# Patient Record
Sex: Female | Born: 1985 | Race: White | Hispanic: No | Marital: Married | Smoking: Never smoker
Health system: Southern US, Community
[De-identification: ages and names within clinical notes are randomized; demographics above are authoritative.]

## PROBLEM LIST (undated history)

## (undated) DIAGNOSIS — N200 Calculus of kidney: Secondary | ICD-10-CM

## (undated) DIAGNOSIS — N809 Endometriosis, unspecified: Secondary | ICD-10-CM

## (undated) HISTORY — PX: TYMPANOSTOMY TUBE PLACEMENT: SHX32

## (undated) HISTORY — PX: TONSILLECTOMY: SUR1361

---

## 2013-07-19 ENCOUNTER — Emergency Department: Payer: Self-pay

## 2013-07-19 LAB — URINALYSIS, COMPLETE
Bacteria: NONE SEEN
Bilirubin,UR: NEGATIVE
Blood: NEGATIVE
Glucose,UR: NEGATIVE mg/dL (ref 0–75)
Ketone: NEGATIVE
Leukocyte Esterase: NEGATIVE
Nitrite: NEGATIVE
Ph: 6 (ref 4.5–8.0)
Protein: NEGATIVE
RBC, UR: NONE SEEN /HPF (ref 0–5)
Specific Gravity: 1.01 (ref 1.003–1.030)
Squamous Epithelial: 1
WBC UR: NONE SEEN /HPF (ref 0–5)

## 2013-07-19 LAB — CBC
HCT: 36.7 % (ref 35.0–47.0)
HGB: 12.6 g/dL (ref 12.0–16.0)
MCH: 29.9 pg (ref 26.0–34.0)
MCHC: 34.2 g/dL (ref 32.0–36.0)
MCV: 87 fL (ref 80–100)
PLATELETS: 223 10*3/uL (ref 150–440)
RBC: 4.2 10*6/uL (ref 3.80–5.20)
RDW: 12 % (ref 11.5–14.5)
WBC: 8.2 10*3/uL (ref 3.6–11.0)

## 2013-07-19 LAB — HCG, QUANTITATIVE, PREGNANCY: BETA HCG, QUANT.: 34965 m[IU]/mL — AB

## 2013-08-16 ENCOUNTER — Emergency Department: Payer: Self-pay | Admitting: Emergency Medicine

## 2014-01-02 ENCOUNTER — Observation Stay: Payer: Self-pay | Admitting: Obstetrics and Gynecology

## 2014-01-02 LAB — URINALYSIS, COMPLETE
Bacteria: NONE SEEN
Bilirubin,UR: NEGATIVE
Blood: NEGATIVE
GLUCOSE, UR: NEGATIVE mg/dL (ref 0–75)
LEUKOCYTE ESTERASE: NEGATIVE
Nitrite: NEGATIVE
Ph: 6 (ref 4.5–8.0)
Protein: NEGATIVE
RBC,UR: <1 /HPF (ref 0–5)
SPECIFIC GRAVITY: 1.009 (ref 1.003–1.030)
WBC UR: <1 /HPF (ref 0–5)

## 2014-01-02 LAB — CBC WITH DIFFERENTIAL/PLATELET
Basophil #: 0 10*3/uL (ref 0.0–0.1)
Basophil %: 0.4 %
Eosinophil #: 0 10*3/uL (ref 0.0–0.7)
Eosinophil %: 0.1 %
HCT: 35.9 % (ref 35.0–47.0)
HGB: 12 g/dL (ref 12.0–16.0)
Lymphocyte #: 1.6 10*3/uL (ref 1.0–3.6)
Lymphocyte %: 18.1 %
MCH: 29.6 pg (ref 26.0–34.0)
MCHC: 33.5 g/dL (ref 32.0–36.0)
MCV: 88 fL (ref 80–100)
MONOS PCT: 3 %
Monocyte #: 0.3 x10 3/mm (ref 0.2–0.9)
Neutrophil #: 7.1 10*3/uL — ABNORMAL HIGH (ref 1.4–6.5)
Neutrophil %: 78.4 %
Platelet: 158 10*3/uL (ref 150–440)
RBC: 4.07 10*6/uL (ref 3.80–5.20)
RDW: 13.3 % (ref 11.5–14.5)
WBC: 9 10*3/uL (ref 3.6–11.0)

## 2014-01-02 LAB — BASIC METABOLIC PANEL
ANION GAP: 10 (ref 7–16)
BUN: 4 mg/dL — ABNORMAL LOW (ref 7–18)
CALCIUM: 8.6 mg/dL (ref 8.5–10.1)
CHLORIDE: 113 mmol/L — AB (ref 98–107)
CREATININE: 0.45 mg/dL — AB (ref 0.60–1.30)
Co2: 20 mmol/L — ABNORMAL LOW (ref 21–32)
EGFR (African American): >60
EGFR (Non-African Amer.): >60
Glucose: 71 mg/dL (ref 65–99)
OSMOLALITY: 280 (ref 275–301)
POTASSIUM: 3.5 mmol/L (ref 3.5–5.1)
Sodium: 143 mmol/L (ref 136–145)

## 2014-01-02 IMAGING — US US RENAL KIDNEY
1 series · 14 of 25 positions shown · non-contrast
Comparison: None.

CLINICAL DATA: Left flank pain.

EXAM:
RENAL/URINARY TRACT ULTRASOUND COMPLETE

[Series 1: us renal kidney · 0.28mm/px · 14 of 41 slices shown]
[im 1/41]
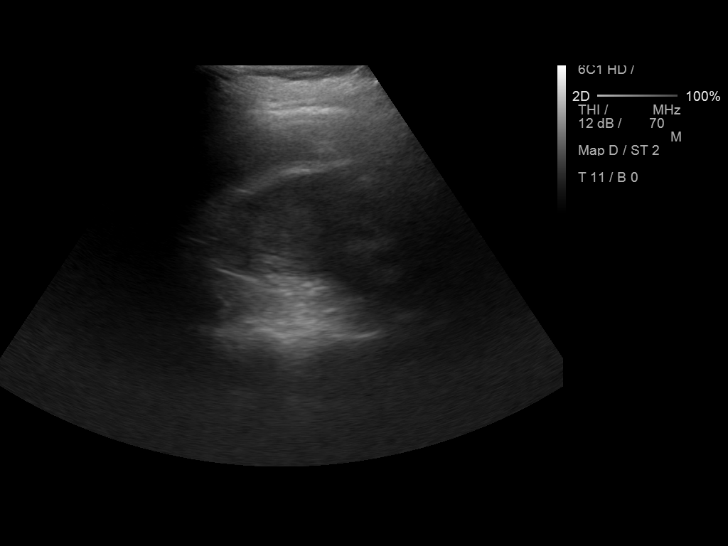
[im 4/41]
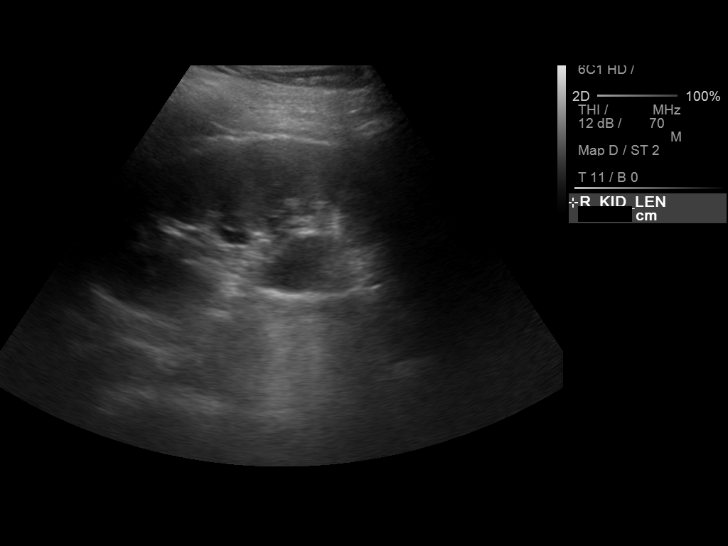
[im 7/41]
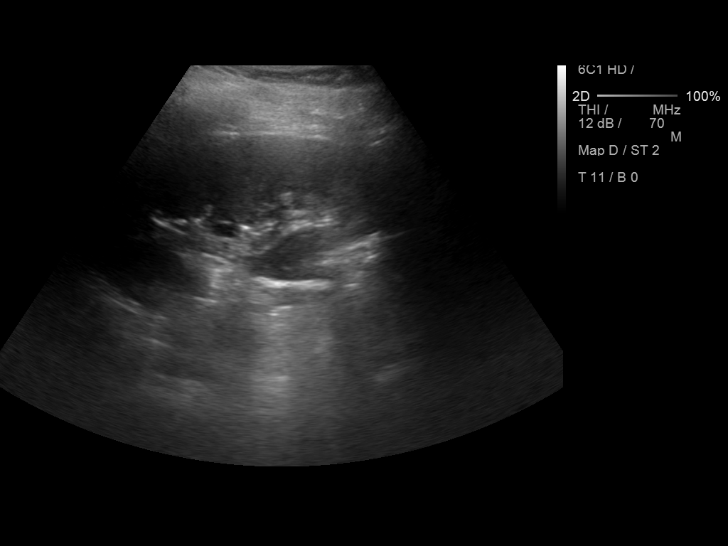
[im 11/41]
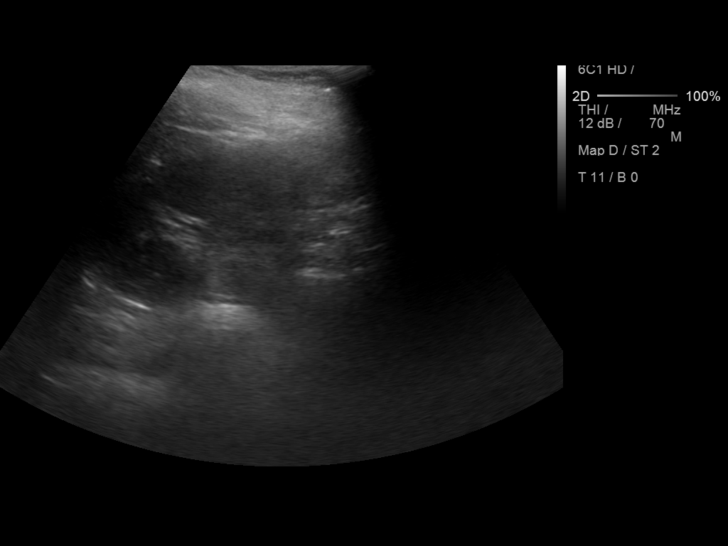
[im 14/41]
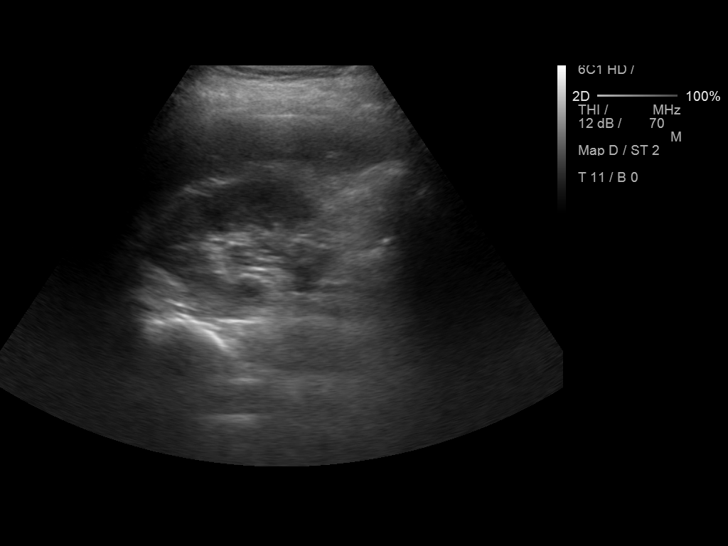
[im 16/41]
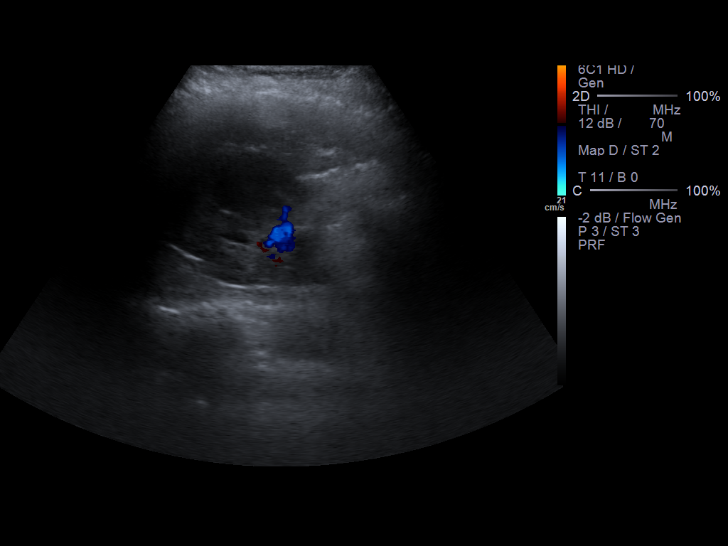
[im 19/41]
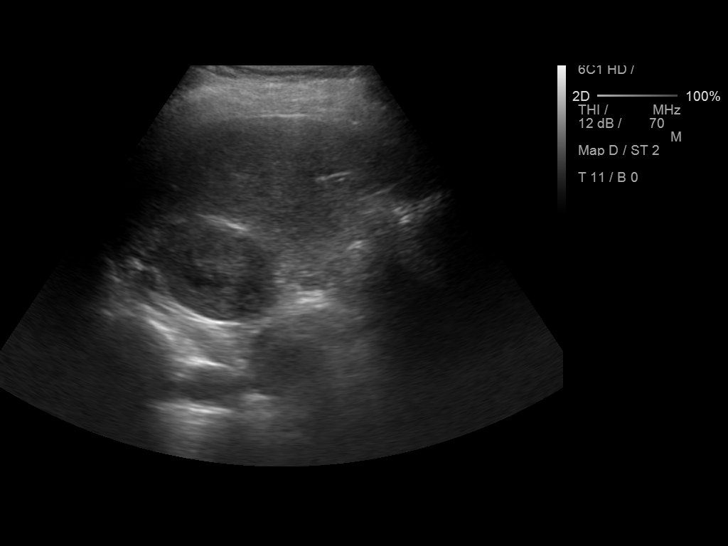
[im 22/41]
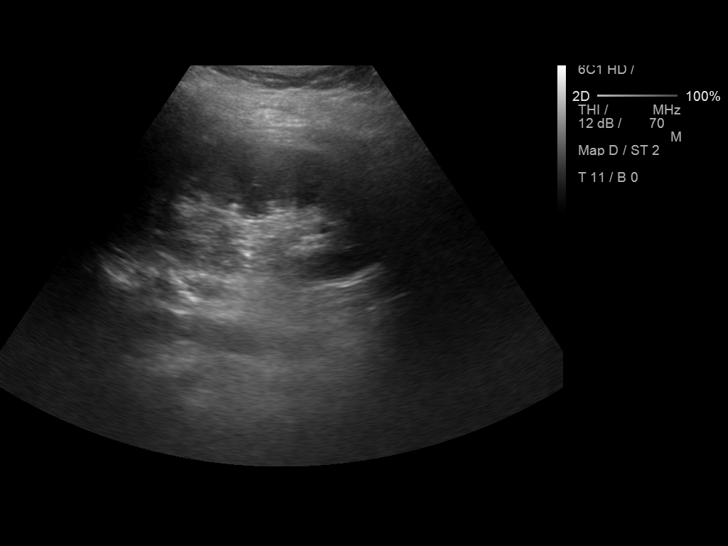
[im 26/41]
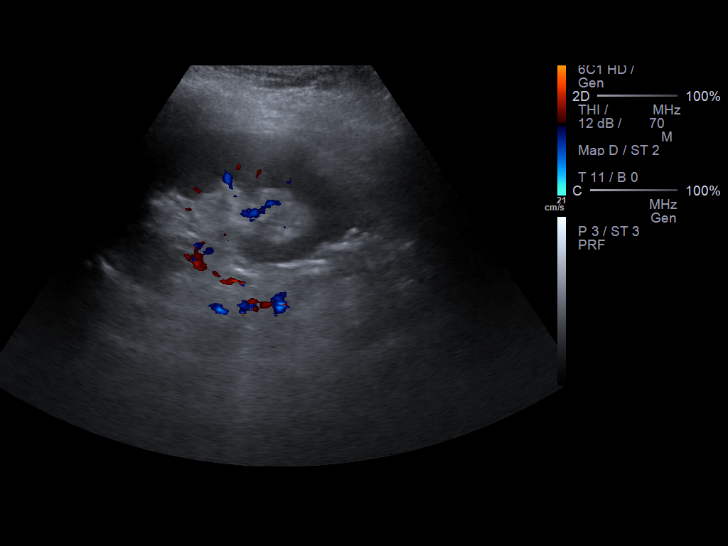
[im 27/41]
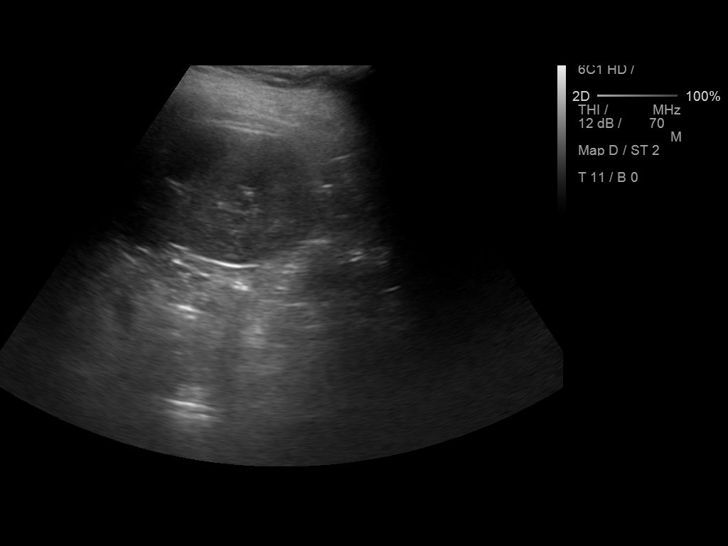
[im 31/41]
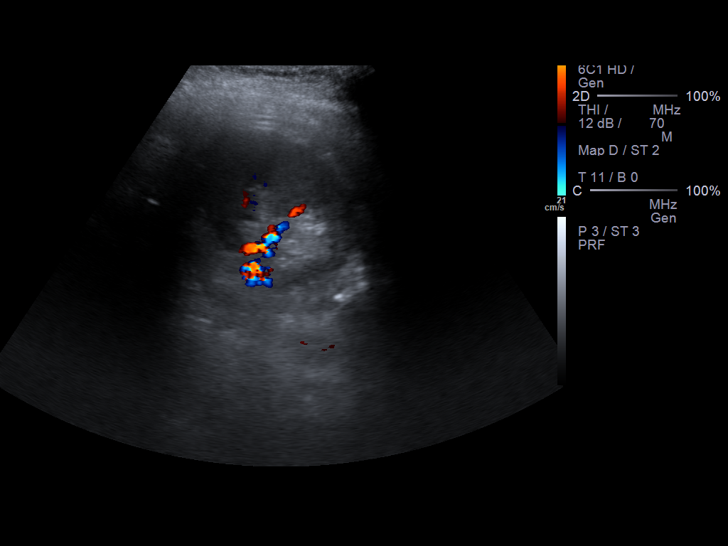
[im 34/41]
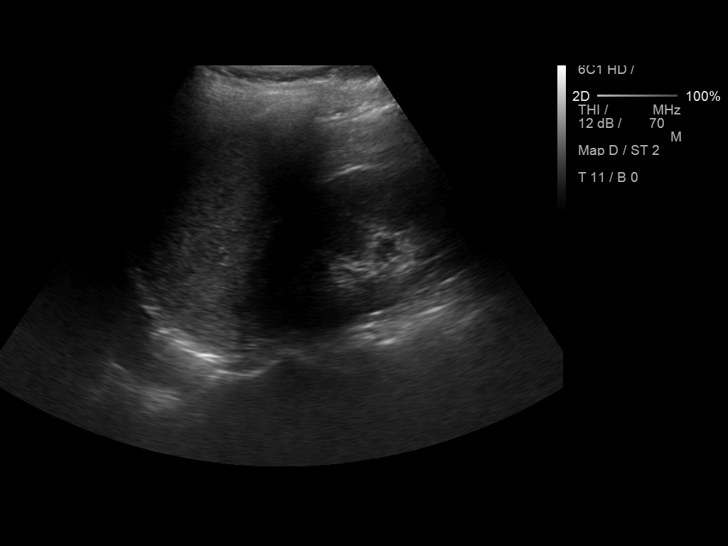
[im 37/41]
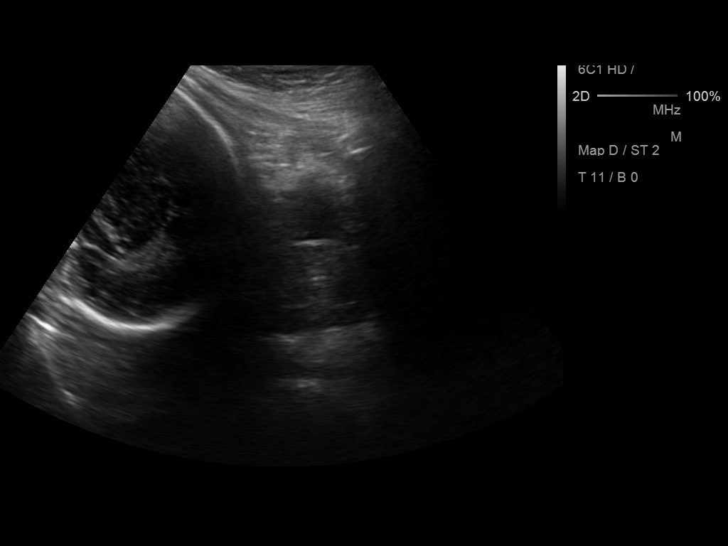
[im 41/41]
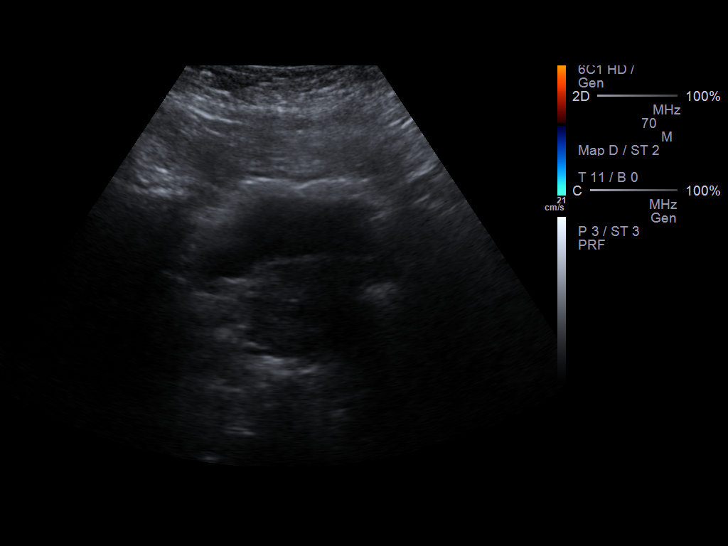

[14 of 25 positions shown; findings below may reference images not displayed]

FINDINGS: Right Kidney:

Length: 12.5 cm. Echogenicity within normal limits. Prominent
extrarenal pelvis is noted without definite hydronephrosis.

Left Kidney:

Length: 10.9 cm. Echogenicity within normal limits. No mass or
hydronephrosis visualized.

Bladder:

Appears normal for degree of bladder distention.
IMPRESSION: Prominent extrarenal pelvis is noted on the right without definite
hydronephrosis. No other renal abnormality seen.

## 2014-01-04 ENCOUNTER — Observation Stay: Payer: Self-pay

## 2014-01-04 LAB — DRUG SCREEN, URINE

## 2014-01-04 LAB — URINALYSIS, COMPLETE
Bacteria: NONE SEEN
Bilirubin,UR: NEGATIVE
Glucose,UR: NEGATIVE mg/dL (ref 0–75)
Leukocyte Esterase: NEGATIVE
Nitrite: NEGATIVE
PH: 6 (ref 4.5–8.0)
PROTEIN: NEGATIVE
Specific Gravity: 1.004 (ref 1.003–1.030)
Squamous Epithelial: 1
WBC UR: 1 /HPF (ref 0–5)

## 2014-01-04 LAB — URINE CULTURE

## 2014-01-04 IMAGING — US US RENAL KIDNEY
1 series · 14 of 25 positions shown · non-contrast
Comparison: Renal ultrasound January 02, 2014.

CLINICAL DATA: Suspect kidney stones and hydronephrosis ; the
patient is 35 weeks pregnant and complaining of low back pain.

EXAM:
RENAL/URINARY TRACT ULTRASOUND COMPLETE

[Series 1: us renal kidney · 0.28mm/px · 14 of 36 slices shown]
[im 1/36]
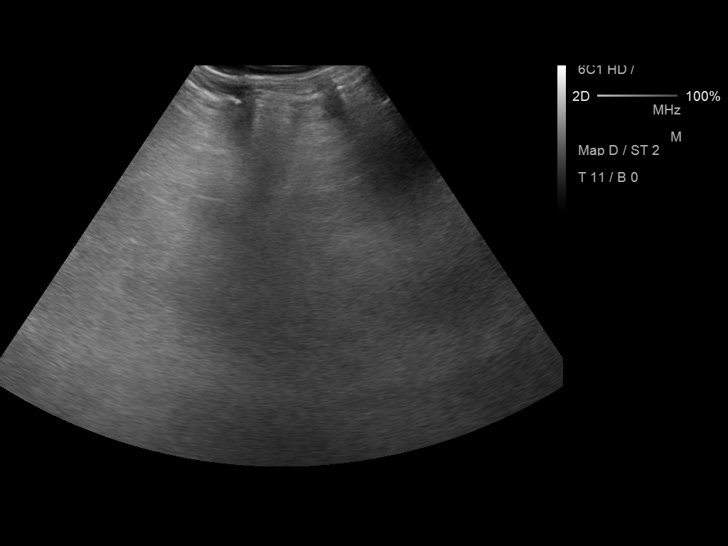
[im 3/36]
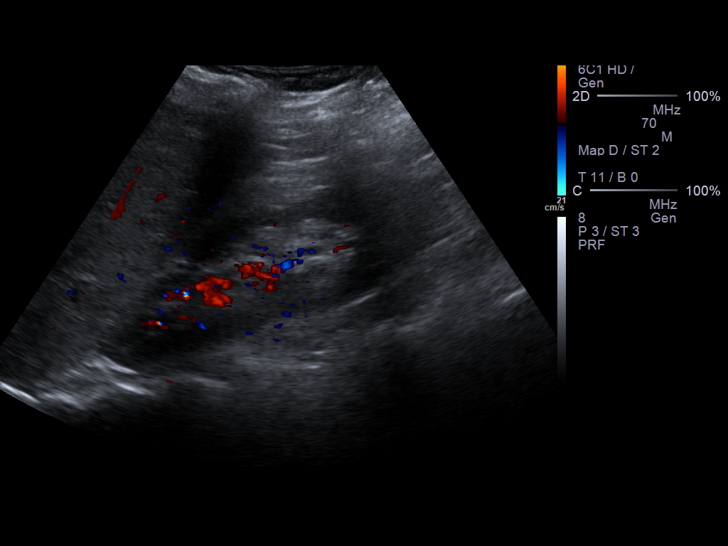
[im 6/36]
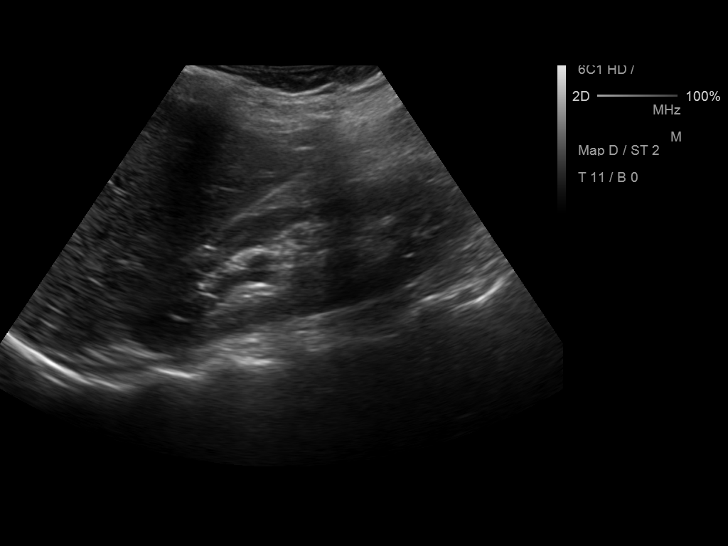
[im 9/36]
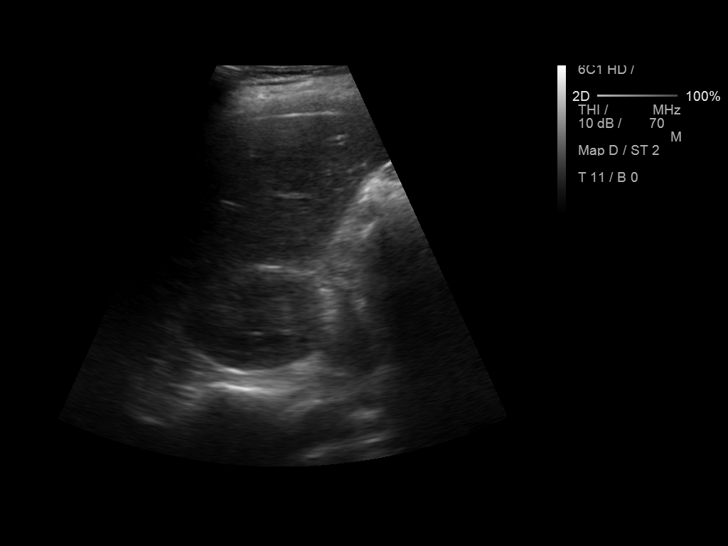
[im 12/36]
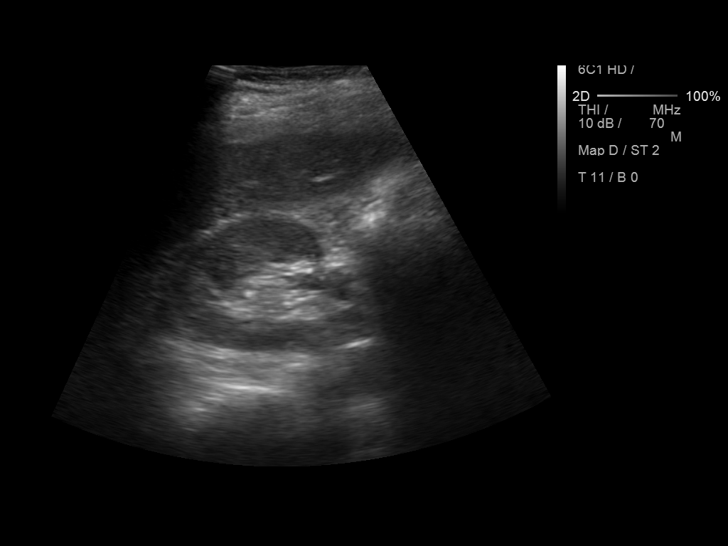
[im 14/36]
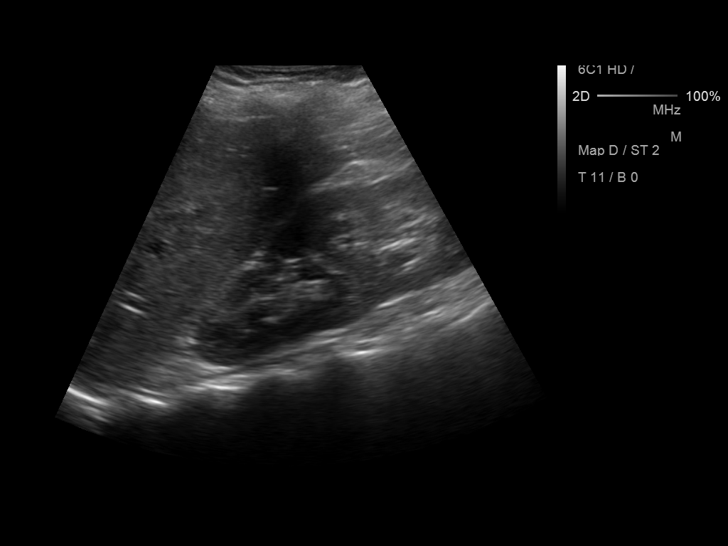
[im 17/36]
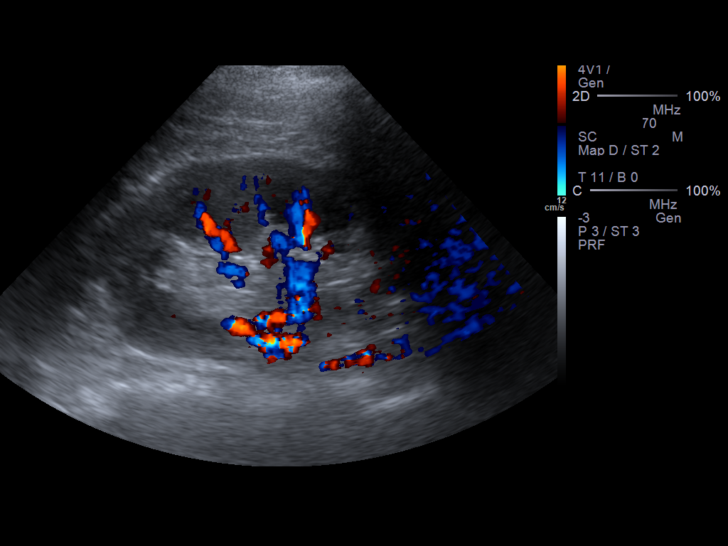
[im 19/36]
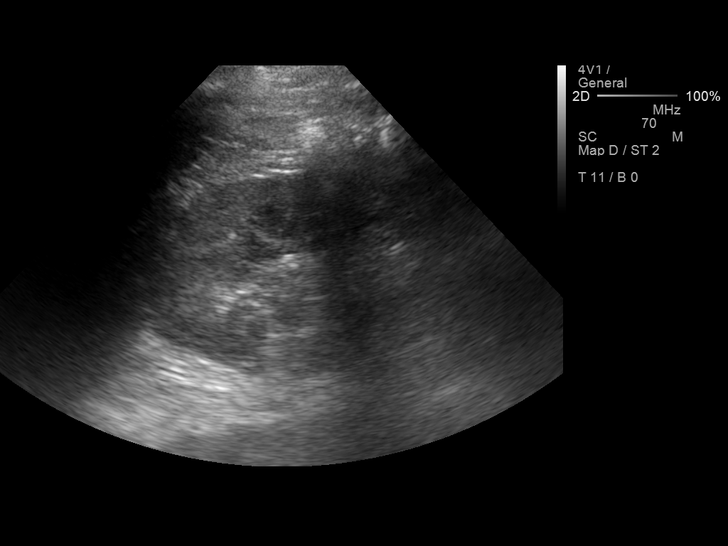
[im 22/36]
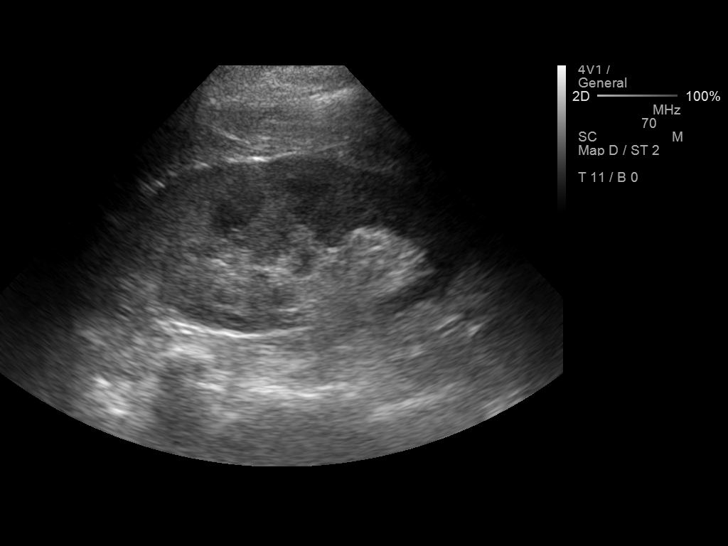
[im 24/36]
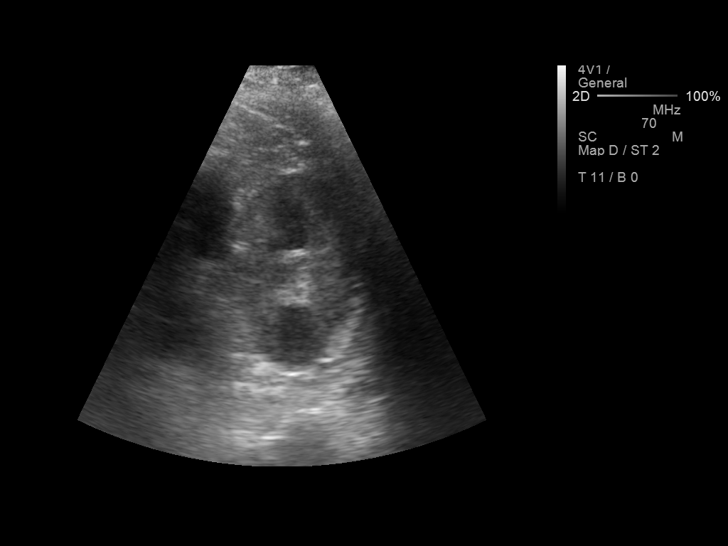
[im 27/36]
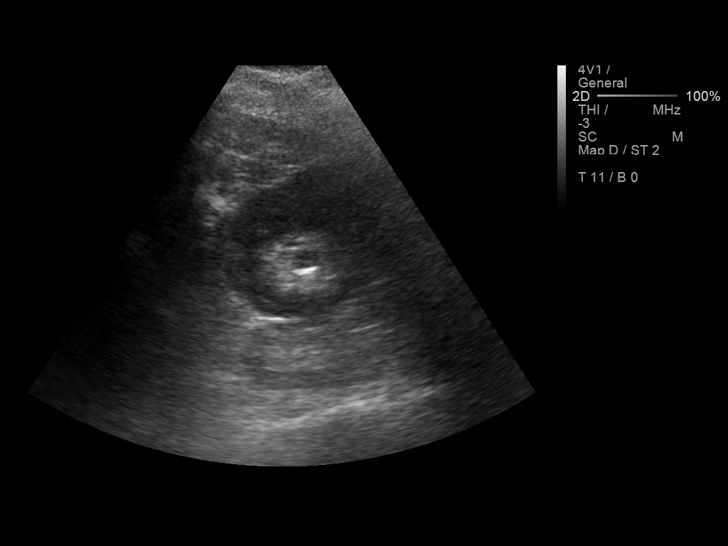
[im 30/36]
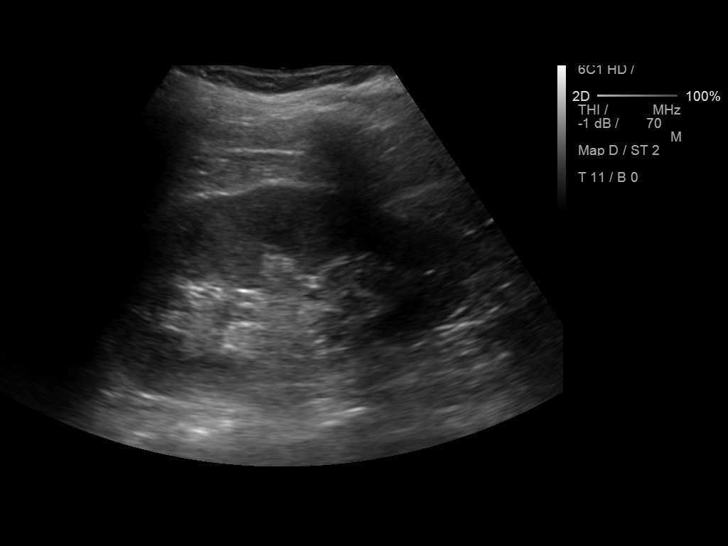
[im 33/36]
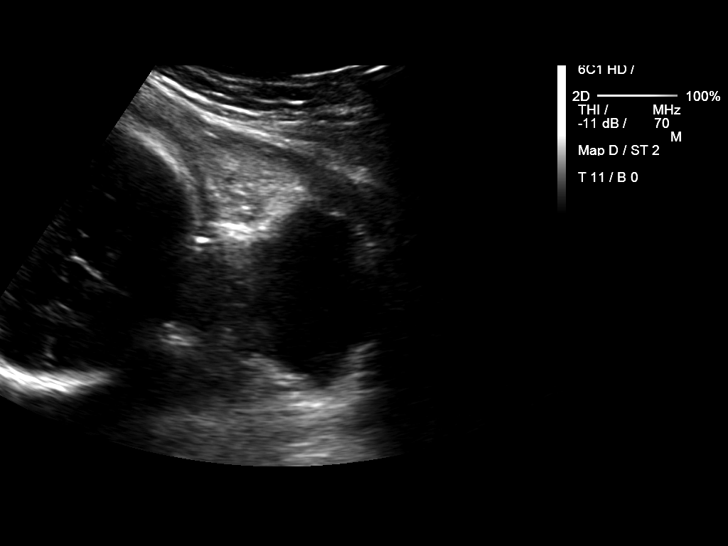
[im 36/36]
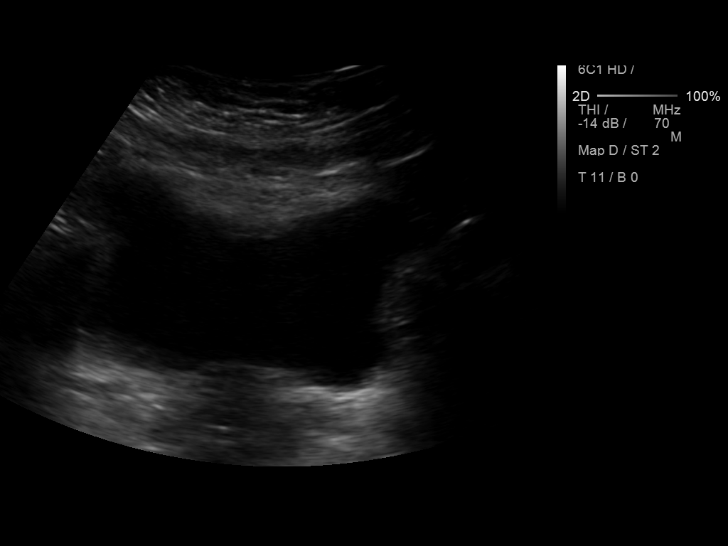

[14 of 25 positions shown; findings below may reference images not displayed]

FINDINGS: Right Kidney:

Length: 13 cm. Echogenicity within normal limits. No mass or
hydronephrosis visualized.

Left Kidney:

Length: 11.5 cm. Echogenicity within normal limits. No mass or
hydronephrosis visualized.

Bladder:

Appears normal for degree of bladder distention.
IMPRESSION: There is no evidence of significant hydronephrosis. No calcified
shadowing stones are demonstrated.

## 2014-08-28 NOTE — H&P (Signed)
L&D Evaluation:  History Expanded:  HPI 29 y/o G1 @ 35 wks (EDC 10/22 based on 7wk u/s), with c/o back and flank pain. Pt initially evaluated for same on 9/15. Work-up essentially normal, Renal US showing mild dilated pelvises. Pt was given Rx for #15 Percocet which she states she took around the clock since leaving. Her last dose was around midnight and pt now reports intense pain has returned. Reports pain is similar, but much more intense that prior kidney stones she has had.  Preg c/b IUI pregnancy, h/o nephrolithiasis and endometriosis.  She denies any current issues with this pregnancy. [place]. She denies any aggravating or precipitating event and denies any OB s/s such as VB, LOF or decreased FM, fevers, chills. Reporting nausea and dry heaving with pain.   Patient's Medical History as per hpi. endometriosis   Patient's Surgical History diagnostic laparoscopy x 3 for endometriosis   Medications Pre Natal Vitamins  Percocet #15 since 9/15   Allergies vancomycin, lyrica, flagyl   Social History none   Exam:  Vital Signs stable   General appears in pain and uncomfortable   Mental Status clear   Chest ctab   Heart rrr no mrgs   Abdomen gravid, non-tender   Back + CVAT bilaterally   Edema no edema   Pelvic 1/50/-3 per RN   Mebranes Intact   FHT baseline 140, moderate variability, + accels   Ucx absent   Impression:  Impression IUP at 35 weeks with back pain, ?nephrolithiasis   Plan:  Plan UA   Comments UDS to ensure + oxycodone IV fluids, Morphine, phenergan   Electronic Signatures: Jazzmyn Filion, Marta Lamasamara K (CNM)  (Signed 17-Sep-15 07:04)  Authored: L&D Evaluation   Last Updated: 17-Sep-15 07:04 by Vella KohlerBrothers, Kiaan Overholser K (CNM)

## 2014-08-28 NOTE — H&P (Signed)
L&D Evaluation:  History:  HPI -CC: mid low back pain x 1 day  -HPI: 29 y/o G1 @ 34/5 (EDC 10/22 based on 7wk u/s), with above CC. Preg c/b IUI pregnancy, h/o nephrolithiasis and endometriosis.  She denies any current issues with this pregnancy. [place] and started having the low back pain while riding on the airplane last night.  She denies any aggravating or precipitating event and denies any OB s/s such as VB, LOF or decreased FM, fevers, chills. +rare abdominal cramping  Patient states that she's had some increased urinary frequency with low amounts of voiding but denies any dysuria, hematuria, malodorous urine. She states that the pain radiates somewhat up the left back and is starting to feel similar to prior stone episodes, with her last one being last year and passed spontaneously.  She did have small amount of n/v of clear fluid with pain exacerbation but no other GI s/s.  She took some flexeril, which she got early in pregnancy and some apap earlier this afternoon.   Patient's Medical History as per hpi. endometriosis   Patient's Surgical History diagnostic laparoscopy x 3 for endometriosis   Medications as per hpi   Allergies vancomycin, lyrica, flagyl   Social History none   Exam:  Vital Signs AF VS normal and stable   General appears in pain and uncomfortable   Mental Status clear   Chest ctab   Heart rrr no mrgs   Abdomen gravid, non-tender   Back ? CVAT bilaterally. back is just mildly diffusely TTP, especially in the low back area   Edema no edema   Pelvic cl/50/-3/intermediate/mid position   Mebranes Intact   FHT 160 baseline, +accels, no decels (some maternal artifact), mod var   Ucx absent   Impression:  Impression ?stone, low back pain; patient currently stable   Plan:  Comments *IUP: pulse ox placed but patient moving around a lot, due to the discomfort, and with great FM causing some difficulty  with tracinig. will try and obtain records *EOL: recheck cx in 4590mins/when labs are back *Renal: U/A, UCx, BMP, CBC and renal u/s ordered. Depending on lab results, may admit for presumptive pyelo *FEN/GI: regular diet, IVF bolus 1L and MIVF at 150mL.  PRN IV zofran. low suspicion for GI process, such as appy, given her s/s and lack of GI s/s outside of with pain association *Analgesia: PO PRNs ordered   Electronic Signatures: [place] BingPickens, Zvi Duplantis (MD)  (Signed 15-Sep-15 19:47)  Authored: L&D Evaluation   Last Updated: 15-Sep-15 19:47 by [place] BingPickens, Amarilis Belflower (MD)

## 2016-05-02 ENCOUNTER — Emergency Department
Admission: EM | Admit: 2016-05-02 | Discharge: 2016-05-02 | Disposition: A | Discharge status: Home / Self Care | Attending: Emergency Medicine | Admitting: Emergency Medicine

## 2016-05-02 ENCOUNTER — Encounter: Payer: Self-pay | Admitting: Emergency Medicine

## 2016-05-02 DIAGNOSIS — A4902 Methicillin resistant Staphylococcus aureus infection, unspecified site: Secondary | ICD-10-CM | POA: Insufficient documentation

## 2016-05-02 DIAGNOSIS — R21 Rash and other nonspecific skin eruption: Secondary | ICD-10-CM | POA: Diagnosis present

## 2016-05-02 DIAGNOSIS — B958 Unspecified staphylococcus as the cause of diseases classified elsewhere: Secondary | ICD-10-CM

## 2016-05-02 DIAGNOSIS — L089 Local infection of the skin and subcutaneous tissue, unspecified: Secondary | ICD-10-CM

## 2016-05-02 HISTORY — DX: Endometriosis, unspecified: N80.9

## 2016-05-02 HISTORY — DX: Calculus of kidney: N20.0

## 2016-05-02 MED ORDER — EUCERIN EX CREA: TOPICAL_CREAM | CUTANEOUS | 0 refills | Status: AC | PRN

## 2016-05-02 MED ORDER — CLINDAMYCIN HCL 300 MG PO CAPS: 300.0000 mg | ORAL_CAPSULE | Freq: Four times a day (QID) | ORAL | 0 refills | Status: AC

## 2016-05-02 NOTE — ED Triage Notes (Signed)
Patient states that she was diagnosed with staph infection under bilateral arms. Patient states that she started antibiotics about 2 weeks ago but that the pain has become worse tonight. Patient tearful at stat desk.

## 2016-05-02 NOTE — ED Notes (Addendum)
Pt reports staph infection under both arms, was on antibiotics, sulfameth, completed course few days ago.

## 2016-05-02 NOTE — ED Provider Notes (Signed)
[facility] Emergency Department Provider Note  ____________________________________________  Time seen: Approximately 11:47 PM  I have reviewed the triage vital signs and the nursing notes.   HISTORY  Chief Complaint Rash    HPI Stacy Lee is a 31 y.o. female who presents emergency department complaining of a staph infection in her bilateral armpits. Patient states that she was diagnosed with staph in [place] and was placed on Bactrim. Patient states that this improved symptoms but they have returned. She has no history of recurring skin lesions. She denies any drainage from same. Patient reports that her armpits or chafing adding an increase in pain. Patient denies any fevers or chills, abdominal pain, nausea or vomiting. Patient finished the entire course of antibiotics previously.   Past Medical History:  Diagnosis Date  . Endometriosis   . Kidney stone     There are no active problems to display for this patient.   Past Surgical History:  Procedure Laterality Date  . TONSILLECTOMY    . TYMPANOSTOMY TUBE PLACEMENT      Prior to Admission medications   Medication Sig Start Date End Date Taking? Authorizing Provider  clindamycin (CLEOCIN) 300 MG capsule Take 1 capsule (300 mg total) by mouth 4 (four) times daily. 05/02/16   Delorise Royals Sreenidhi Ganson, PA-C  Skin Protectants, Misc. (EUCERIN) cream Apply topically as needed for dry skin. 05/02/16   Delorise Royals Marquis Diles, PA-C    Allergies Flagyl [metronidazole]; Lyrica [pregabalin]; and Vancomycin  History reviewed. No pertinent family history.  Social History Social History  Substance Use Topics  . Smoking status: Never Smoker  . Smokeless tobacco: Never Used  . Alcohol use Yes     Comment: occasional     Review of Systems  Constitutional: No fever/chills Cardiovascular: no chest pain. Respiratory: no cough. No SOB. Gastrointestinal: No abdominal pain.  No nausea, no vomiting.  No diarrhea.   No constipation. Musculoskeletal: Negative for musculoskeletal pain. Skin: Positive for "staph infection" to bilateral axilla Neurological: Negative for headaches, focal weakness or numbness. 10-point ROS otherwise negative.  ____________________________________________   PHYSICAL EXAM:  VITAL SIGNS: ED Triage Vitals [05/02/16 2254]  Enc Vitals Group     BP (!) 137/91     Pulse Rate 97     Resp      Temp 98.4 F (36.9 C)     Temp Source Oral     SpO2 100 %     Weight 119 lb (54 kg)     Height 5\' 3"  (1.6 m)     Head Circumference      Peak Flow      Pain Score 8     Pain Loc      Pain Edu?      Excl. in GC?      Constitutional: Alert and oriented. Well appearing and in no acute distress. Eyes: Conjunctivae are normal. PERRL. EOMI. Head: Atraumatic. Neck: No stridor.   Hematological/Lymphatic/Immunilogical: No cervical lymphadenopathy. Cardiovascular: Normal rate, regular rhythm. Normal S1 and S2.  Good peripheral circulation. Respiratory: Normal respiratory effort without tachypnea or retractions. Lungs CTAB. Good air entry to the bases with no decreased or absent breath sounds. Musculoskeletal: Full range of motion to all extremities. No gross deformities appreciated. Neurologic:  Normal speech and language. No gross focal neurologic deficits are appreciated.  Skin:  Skin is warm, dry and intact. No rash noted.Erythematous and edematous skin lesions noted to bilateral axilla. No fluctuance. No induration. No drainage. Areas are tender to palpation.  Areas are firm to palpation. There does appear to be chafing from skin to skin contact. Psychiatric: Mood and affect are normal. Speech and behavior are normal. Patient exhibits appropriate insight and judgement.   ____________________________________________   LABS (all labs ordered are listed, but only abnormal results are displayed)  Labs Reviewed - No data to  display ____________________________________________  EKG   ____________________________________________  RADIOLOGY   No results found.  ____________________________________________    PROCEDURES  Procedure(s) performed:    Procedures    Medications - No data to display   ____________________________________________   INITIAL IMPRESSION / ASSESSMENT AND PLAN / ED COURSE  Pertinent labs & imaging results that were available during my care of the patient were reviewed by me and considered in my medical decision making (see chart for details).  Review of the [place] CSRS was performed in accordance of the [facility] prior to dispensing any controlled drugs.  Clinical Course     Patient's diagnosis is consistent with Staph infection to bilateral axilla. No indication for incision and drainage as there is no discernible abscess. No indication for labs or imaging at this time. Patient will be placed on clindamycin for treatment. Patient is given Eucerin for chafing to this region..  Patient is given ED precautions to return to the ED for any worsening or new symptoms.     ____________________________________________  FINAL CLINICAL IMPRESSION(S) / ED DIAGNOSES  Final diagnoses:  Staphylococcal infection of skin      NEW MEDICATIONS STARTED DURING THIS VISIT:  New Prescriptions   CLINDAMYCIN (CLEOCIN) 300 MG CAPSULE    Take 1 capsule (300 mg total) by mouth 4 (four) times daily.   SKIN PROTECTANTS, MISC. (EUCERIN) CREAM    Apply topically as needed for dry skin.        This chart was dictated using voice recognition software/Dragon. Despite best efforts to proofread, errors can occur which can change the meaning. Any change was purely unintentional.    Racheal PatchesJonathan D Aminat [place], PA-C 05/02/16 2353    Emily FilbertJonathan E Williams, MD 05/03/16 0010
